# Patient Record
Sex: Male | Born: 1945 | Race: White | Hispanic: No | Marital: Married | Smoking: Former smoker
Health system: Southern US, Community
[De-identification: ages and names within clinical notes are randomized; demographics above are authoritative.]

## PROBLEM LIST (undated history)

## (undated) DIAGNOSIS — I2510 Atherosclerotic heart disease of native coronary artery without angina pectoris: Secondary | ICD-10-CM

## (undated) DIAGNOSIS — E7800 Pure hypercholesterolemia, unspecified: Secondary | ICD-10-CM

## (undated) DIAGNOSIS — G4730 Sleep apnea, unspecified: Secondary | ICD-10-CM

## (undated) DIAGNOSIS — I10 Essential (primary) hypertension: Secondary | ICD-10-CM

## (undated) DIAGNOSIS — G4733 Obstructive sleep apnea (adult) (pediatric): Secondary | ICD-10-CM

## (undated) DIAGNOSIS — S42114A Nondisplaced fracture of body of scapula, right shoulder, initial encounter for closed fracture: Secondary | ICD-10-CM

## (undated) DIAGNOSIS — I2699 Other pulmonary embolism without acute cor pulmonale: Secondary | ICD-10-CM

## (undated) HISTORY — DX: Atherosclerotic heart disease of native coronary artery without angina pectoris: I25.10

## (undated) HISTORY — DX: Obstructive sleep apnea (adult) (pediatric): G47.33

## (undated) HISTORY — DX: Other pulmonary embolism without acute cor pulmonale: I26.99

## (undated) HISTORY — DX: Nondisplaced fracture of body of scapula, right shoulder, initial encounter for closed fracture: S42.114A

## (undated) HISTORY — DX: Sleep apnea, unspecified: G47.30

---

## 2015-11-18 DIAGNOSIS — I10 Essential (primary) hypertension: Secondary | ICD-10-CM | POA: Insufficient documentation

## 2015-11-18 DIAGNOSIS — Z79899 Other long term (current) drug therapy: Secondary | ICD-10-CM | POA: Insufficient documentation

## 2015-11-18 HISTORY — DX: Essential (primary) hypertension: I10

## 2015-11-18 HISTORY — DX: Other long term (current) drug therapy: Z79.899

## 2015-12-04 DIAGNOSIS — L509 Urticaria, unspecified: Secondary | ICD-10-CM | POA: Insufficient documentation

## 2015-12-04 HISTORY — DX: Urticaria, unspecified: L50.9

## 2016-01-20 DIAGNOSIS — G4733 Obstructive sleep apnea (adult) (pediatric): Secondary | ICD-10-CM | POA: Insufficient documentation

## 2017-11-09 ENCOUNTER — Encounter: Payer: Self-pay | Admitting: Gastroenterology

## 2017-12-24 DIAGNOSIS — M19049 Primary osteoarthritis, unspecified hand: Secondary | ICD-10-CM

## 2017-12-24 HISTORY — DX: Primary osteoarthritis, unspecified hand: M19.049

## 2018-10-13 DIAGNOSIS — D696 Thrombocytopenia, unspecified: Secondary | ICD-10-CM

## 2018-10-13 HISTORY — DX: Thrombocytopenia, unspecified: D69.6

## 2019-03-08 HISTORY — PX: COLONOSCOPY: SHX174

## 2019-04-12 DIAGNOSIS — Z91038 Other insect allergy status: Secondary | ICD-10-CM

## 2019-04-12 HISTORY — DX: Other insect allergy status: Z91.038

## 2020-01-03 DIAGNOSIS — E782 Mixed hyperlipidemia: Secondary | ICD-10-CM | POA: Insufficient documentation

## 2020-01-03 HISTORY — DX: Mixed hyperlipidemia: E78.2

## 2020-07-03 DIAGNOSIS — N529 Male erectile dysfunction, unspecified: Secondary | ICD-10-CM

## 2020-07-03 HISTORY — DX: Male erectile dysfunction, unspecified: N52.9

## 2020-07-26 DIAGNOSIS — M8589 Other specified disorders of bone density and structure, multiple sites: Secondary | ICD-10-CM

## 2020-07-26 HISTORY — DX: Other specified disorders of bone density and structure, multiple sites: M85.89

## 2020-08-03 ENCOUNTER — Inpatient Hospital Stay (HOSPITAL_COMMUNITY)
Admission: EM | Admit: 2020-08-03 | Discharge: 2020-08-10 | DRG: 200 | Disposition: A | Discharge status: Home / Self Care | Payer: Medicare HMO | Attending: General Surgery | Admitting: General Surgery

## 2020-08-03 ENCOUNTER — Encounter (HOSPITAL_COMMUNITY): Payer: Self-pay | Admitting: *Deleted

## 2020-08-03 ENCOUNTER — Emergency Department (HOSPITAL_COMMUNITY): Payer: Medicare HMO

## 2020-08-03 ENCOUNTER — Other Ambulatory Visit: Payer: Self-pay

## 2020-08-03 DIAGNOSIS — E7800 Pure hypercholesterolemia, unspecified: Secondary | ICD-10-CM | POA: Diagnosis present

## 2020-08-03 DIAGNOSIS — R0789 Other chest pain: Secondary | ICD-10-CM | POA: Diagnosis present

## 2020-08-03 DIAGNOSIS — S62664A Nondisplaced fracture of distal phalanx of right ring finger, initial encounter for closed fracture: Secondary | ICD-10-CM

## 2020-08-03 DIAGNOSIS — R911 Solitary pulmonary nodule: Secondary | ICD-10-CM | POA: Diagnosis present

## 2020-08-03 DIAGNOSIS — I10 Essential (primary) hypertension: Secondary | ICD-10-CM | POA: Diagnosis present

## 2020-08-03 DIAGNOSIS — Y92410 Unspecified street and highway as the place of occurrence of the external cause: Secondary | ICD-10-CM

## 2020-08-03 DIAGNOSIS — Z7982 Long term (current) use of aspirin: Secondary | ICD-10-CM | POA: Diagnosis not present

## 2020-08-03 DIAGNOSIS — Z87891 Personal history of nicotine dependence: Secondary | ICD-10-CM | POA: Diagnosis not present

## 2020-08-03 DIAGNOSIS — J939 Pneumothorax, unspecified: Secondary | ICD-10-CM

## 2020-08-03 DIAGNOSIS — Z4682 Encounter for fitting and adjustment of non-vascular catheter: Secondary | ICD-10-CM

## 2020-08-03 DIAGNOSIS — Z20822 Contact with and (suspected) exposure to covid-19: Secondary | ICD-10-CM | POA: Diagnosis present

## 2020-08-03 DIAGNOSIS — S62634A Displaced fracture of distal phalanx of right ring finger, initial encounter for closed fracture: Secondary | ICD-10-CM | POA: Diagnosis present

## 2020-08-03 DIAGNOSIS — S42114A Nondisplaced fracture of body of scapula, right shoulder, initial encounter for closed fracture: Secondary | ICD-10-CM

## 2020-08-03 DIAGNOSIS — S61210A Laceration without foreign body of right index finger without damage to nail, initial encounter: Secondary | ICD-10-CM | POA: Diagnosis present

## 2020-08-03 DIAGNOSIS — S60511A Abrasion of right hand, initial encounter: Secondary | ICD-10-CM | POA: Diagnosis present

## 2020-08-03 DIAGNOSIS — S42111A Displaced fracture of body of scapula, right shoulder, initial encounter for closed fracture: Secondary | ICD-10-CM | POA: Diagnosis present

## 2020-08-03 DIAGNOSIS — S70311A Abrasion, right thigh, initial encounter: Secondary | ICD-10-CM | POA: Diagnosis present

## 2020-08-03 DIAGNOSIS — S270XXA Traumatic pneumothorax, initial encounter: Principal | ICD-10-CM | POA: Diagnosis present

## 2020-08-03 DIAGNOSIS — T1490XA Injury, unspecified, initial encounter: Secondary | ICD-10-CM

## 2020-08-03 DIAGNOSIS — S2241XA Multiple fractures of ribs, right side, initial encounter for closed fracture: Secondary | ICD-10-CM

## 2020-08-03 DIAGNOSIS — Z79899 Other long term (current) drug therapy: Secondary | ICD-10-CM | POA: Diagnosis not present

## 2020-08-03 DIAGNOSIS — S50811A Abrasion of right forearm, initial encounter: Secondary | ICD-10-CM | POA: Diagnosis present

## 2020-08-03 HISTORY — DX: Unspecified rider of other motorcycle injured in collision with pedestrian or animal in traffic accident, initial encounter: V20.99XA

## 2020-08-03 HISTORY — DX: Pure hypercholesterolemia, unspecified: E78.00

## 2020-08-03 HISTORY — DX: Essential (primary) hypertension: I10

## 2020-08-03 LAB — COMPREHENSIVE METABOLIC PANEL
ALT: 24 U/L (ref 0–44)
AST: 23 U/L (ref 15–41)
Albumin: 3.8 g/dL (ref 3.5–5.0)
Alkaline Phosphatase: 56 U/L (ref 38–126)
Anion gap: 7 (ref 5–15)
BUN: 15 mg/dL (ref 8–23)
CO2: 22 mmol/L (ref 22–32)
Calcium: 9.4 mg/dL (ref 8.9–10.3)
Chloride: 105 mmol/L (ref 98–111)
Creatinine, Ser: 0.78 mg/dL (ref 0.61–1.24)
GFR, Estimated: >60 mL/min (ref >60)
Glucose, Bld: 112 mg/dL — ABNORMAL HIGH (ref 70–99)
Potassium: 3.6 mmol/L (ref 3.5–5.1)
Sodium: 134 mmol/L — ABNORMAL LOW (ref 135–145)
Total Bilirubin: 0.8 mg/dL (ref 0.3–1.2)
Total Protein: 6.3 g/dL — ABNORMAL LOW (ref 6.5–8.1)

## 2020-08-03 LAB — CBC
HCT: 42.4 % (ref 39.0–52.0)
HCT: 43 % (ref 39.0–52.0)
Hemoglobin: 14.2 g/dL (ref 13.0–17.0)
Hemoglobin: 14.6 g/dL (ref 13.0–17.0)
MCH: 30.5 pg (ref 26.0–34.0)
MCH: 30.4 pg (ref 26.0–34.0)
MCHC: 33.5 g/dL (ref 30.0–36.0)
MCHC: 34 g/dL (ref 30.0–36.0)
MCV: 91.2 fL (ref 80.0–100.0)
MCV: 89.6 fL (ref 80.0–100.0)
Platelets: 118 10*3/uL — ABNORMAL LOW (ref 150–400)
Platelets: 141 10*3/uL — ABNORMAL LOW (ref 150–400)
RBC: 4.65 MIL/uL (ref 4.22–5.81)
RBC: 4.8 MIL/uL (ref 4.22–5.81)
RDW: 12.1 % (ref 11.5–15.5)
RDW: 11.9 % (ref 11.5–15.5)
WBC: 6.1 10*3/uL (ref 4.0–10.5)
WBC: 7.7 10*3/uL (ref 4.0–10.5)
nRBC: 0 % (ref 0.0–0.2)
nRBC: 0 % (ref 0.0–0.2)

## 2020-08-03 LAB — RESP PANEL BY RT-PCR (FLU A&B, COVID) ARPGX2
Influenza A by PCR: NEGATIVE
Influenza B by PCR: NEGATIVE
SARS Coronavirus 2 by RT PCR: NEGATIVE

## 2020-08-03 LAB — I-STAT CHEM 8, ED
BUN: 17 mg/dL (ref 8–23)
Calcium, Ion: 1.21 mmol/L (ref 1.15–1.40)
Chloride: 103 mmol/L (ref 98–111)
Creatinine, Ser: 0.7 mg/dL (ref 0.61–1.24)
Glucose, Bld: 108 mg/dL — ABNORMAL HIGH (ref 70–99)
HCT: 40 % (ref 39.0–52.0)
Hemoglobin: 13.6 g/dL (ref 13.0–17.0)
Potassium: 3.6 mmol/L (ref 3.5–5.1)
Sodium: 135 mmol/L (ref 135–145)
TCO2: 23 mmol/L (ref 22–32)

## 2020-08-03 LAB — URINALYSIS, ROUTINE W REFLEX MICROSCOPIC
Bacteria, UA: NONE SEEN
Bilirubin Urine: NEGATIVE
Glucose, UA: NEGATIVE mg/dL
Ketones, ur: NEGATIVE mg/dL
Leukocytes,Ua: NEGATIVE
Nitrite: NEGATIVE
Protein, ur: NEGATIVE mg/dL
Specific Gravity, Urine: 1.012 (ref 1.005–1.030)
pH: 6 (ref 5.0–8.0)

## 2020-08-03 LAB — LACTIC ACID, PLASMA: Lactic Acid, Venous: 1.4 mmol/L (ref 0.5–1.9)

## 2020-08-03 LAB — ETHANOL: Alcohol, Ethyl (B): <10 mg/dL (ref <10)

## 2020-08-03 LAB — PROTIME-INR
INR: 1.1 (ref 0.8–1.2)
Prothrombin Time: 14.5 seconds (ref 11.4–15.2)

## 2020-08-03 LAB — CREATININE, SERUM
Creatinine, Ser: 0.7 mg/dL (ref 0.61–1.24)
GFR, Estimated: >60 mL/min (ref >60)

## 2020-08-03 LAB — SAMPLE TO BLOOD BANK

## 2020-08-03 MED ORDER — DOCUSATE SODIUM 100 MG PO CAPS
100.0000 mg | ORAL_CAPSULE | Freq: Two times a day (BID) | ORAL | Status: DC
  Administered 2020-08-04 – 2020-08-09 (×12): 100 mg via ORAL
  Filled 2020-08-03 (×13): qty 1

## 2020-08-03 MED ORDER — ONDANSETRON HCL 4 MG/2ML IJ SOLN
4.0000 mg | Freq: Once | INTRAMUSCULAR | Status: AC
  Administered 2020-08-03: 4 mg via INTRAVENOUS
  Filled 2020-08-03: qty 2

## 2020-08-03 MED ORDER — BACITRACIN ZINC 500 UNIT/GM EX OINT
TOPICAL_OINTMENT | Freq: Two times a day (BID) | CUTANEOUS | Status: DC
  Administered 2020-08-06: 1 via TOPICAL
  Filled 2020-08-03 (×3): qty 28.35

## 2020-08-03 MED ORDER — KETOTIFEN FUMARATE 0.025 % OP SOLN: 1.0000 [drp] | Freq: Every day | OPHTHALMIC | Status: DC | PRN

## 2020-08-03 MED ORDER — SODIUM CHLORIDE 0.9% FLUSH: 3.0000 mL | INTRAVENOUS | Status: DC | PRN

## 2020-08-03 MED ORDER — TRAMADOL HCL 50 MG PO TABS
50.0000 mg | ORAL_TABLET | Freq: Four times a day (QID) | ORAL | Status: DC | PRN
Start: 2020-08-03 — End: 2020-08-10
  Administered 2020-08-05 – 2020-08-09 (×2): 50 mg via ORAL
  Filled 2020-08-03 (×2): qty 1

## 2020-08-03 MED ORDER — POLYVINYL ALCOHOL 1.4 % OP SOLN: 1.0000 [drp] | Freq: Every day | OPHTHALMIC | Status: DC | PRN

## 2020-08-03 MED ORDER — SODIUM CHLORIDE 0.9 % IV SOLN: INTRAVENOUS | Status: DC

## 2020-08-03 MED ORDER — OXYCODONE HCL 5 MG PO TABS
5.0000 mg | ORAL_TABLET | ORAL | Status: DC | PRN
  Administered 2020-08-03 – 2020-08-04 (×2): 10 mg via ORAL
  Filled 2020-08-03 (×2): qty 2

## 2020-08-03 MED ORDER — MORPHINE SULFATE (PF) 2 MG/ML IV SOLN
1.0000 mg | INTRAVENOUS | Status: DC | PRN
  Administered 2020-08-03: 2 mg via INTRAVENOUS
  Filled 2020-08-03: qty 1

## 2020-08-03 MED ORDER — SODIUM CHLORIDE 0.9 % IV SOLN: 250.0000 mL | INTRAVENOUS | Status: DC | PRN

## 2020-08-03 MED ORDER — MORPHINE SULFATE (PF) 4 MG/ML IV SOLN
4.0000 mg | Freq: Once | INTRAVENOUS | Status: AC
  Administered 2020-08-03: 4 mg via INTRAVENOUS
  Filled 2020-08-03: qty 1

## 2020-08-03 MED ORDER — AMLODIPINE BESYLATE 10 MG PO TABS
10.0000 mg | ORAL_TABLET | Freq: Every day | ORAL | Status: DC
  Administered 2020-08-04 – 2020-08-09 (×6): 10 mg via ORAL
  Filled 2020-08-03 (×6): qty 1

## 2020-08-03 MED ORDER — IOHEXOL 300 MG/ML  SOLN
100.0000 mL | Freq: Once | INTRAMUSCULAR | Status: AC | PRN
  Administered 2020-08-03: 100 mL via INTRAVENOUS

## 2020-08-03 MED ORDER — ADULT MULTIVITAMIN W/MINERALS CH
1.0000 | ORAL_TABLET | Freq: Every day | ORAL | Status: DC
  Administered 2020-08-04 – 2020-08-09 (×6): 1 via ORAL
  Filled 2020-08-03 (×6): qty 1

## 2020-08-03 MED ORDER — ONDANSETRON 4 MG PO TBDP: 4.0000 mg | ORAL_TABLET | Freq: Four times a day (QID) | ORAL | Status: DC | PRN

## 2020-08-03 MED ORDER — SODIUM CHLORIDE 0.9 % IV BOLUS
1000.0000 mL | Freq: Once | INTRAVENOUS | Status: AC
  Administered 2020-08-03: 1000 mL via INTRAVENOUS

## 2020-08-03 MED ORDER — ROSUVASTATIN CALCIUM 5 MG PO TABS
10.0000 mg | ORAL_TABLET | Freq: Every morning | ORAL | Status: DC
  Administered 2020-08-04 – 2020-08-10 (×7): 10 mg via ORAL
  Filled 2020-08-03 (×7): qty 2

## 2020-08-03 MED ORDER — SODIUM CHLORIDE 0.9% FLUSH
3.0000 mL | Freq: Two times a day (BID) | INTRAVENOUS | Status: DC
  Administered 2020-08-04 – 2020-08-10 (×13): 3 mL via INTRAVENOUS

## 2020-08-03 MED ORDER — ACETAMINOPHEN 325 MG PO TABS: 650.0000 mg | ORAL_TABLET | ORAL | Status: DC | PRN

## 2020-08-03 MED ORDER — ONDANSETRON HCL 4 MG/2ML IJ SOLN
4.0000 mg | Freq: Four times a day (QID) | INTRAMUSCULAR | Status: DC | PRN
  Administered 2020-08-04 (×2): 4 mg via INTRAVENOUS
  Filled 2020-08-03 (×2): qty 2

## 2020-08-03 MED ORDER — ENOXAPARIN SODIUM 30 MG/0.3ML IJ SOSY
30.0000 mg | PREFILLED_SYRINGE | Freq: Two times a day (BID) | INTRAMUSCULAR | Status: DC
  Administered 2020-08-04 – 2020-08-10 (×13): 30 mg via SUBCUTANEOUS
  Filled 2020-08-03 (×13): qty 0.3

## 2020-08-03 NOTE — ED Notes (Signed)
Pt to Ct
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
## 2020-08-03 NOTE — ED Notes (Signed)
Attempted to call report and was advised someone would call me back 

## 2020-08-03 NOTE — Progress Notes (Signed)
   08/03/20 1251  Clinical Encounter Type  Visited With Patient not available  Visit Type Trauma  Referral From Nurse  Consult/Referral To Chaplain   Chaplain is not needed at this time. Chaplain remains available. This note was prepared by Deneen Harts, M.Div..  For questions please contact by phone [phone].

## 2020-08-03 NOTE — ED Notes (Signed)
Wife is at bedside. Pt is requesting something to eat and drink. Advised would ask provider reference same. Wife Sherree Faiella cell [phone] home [phone] would like a call when pt is admitted.
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
## 2020-08-03 NOTE — Progress Notes (Signed)
Pt has pneumo, CPAP is contraindicated at this time. RT will continue to monitor.

## 2020-08-03 NOTE — ED Notes (Signed)
Dr. Particia Nearing at bedside updating pt on plan of care.

## 2020-08-03 NOTE — ED Notes (Signed)
Attempted to call report and was advised someone would be calling me back

## 2020-08-03 NOTE — ED Notes (Signed)
Received verbal report from Jordan N. RN at this time 
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
## 2020-08-03 NOTE — ED Notes (Signed)
Rt side and rt shoulder pain at this time. With abrasion noted to rt shoulder.

## 2020-08-03 NOTE — ED Notes (Signed)
Wife called and made aware of admit status 

## 2020-08-03 NOTE — Progress Notes (Signed)
Orthopedic Tech Progress Note Patient Details:  Timothy [name] 1945-12-17 [id number]  Ortho Devices Type of Ortho Device: Finger splint Ortho Device/Splint Location: rue. 4th finger Ortho Device/Splint Interventions: Ordered, Application, Adjustment   Post Interventions Patient Tolerated: Well Instructions Provided: Care of device, Adjustment of device  Trinna Post 08/03/2020, 9:24 PM
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
## 2020-08-04 ENCOUNTER — Inpatient Hospital Stay (HOSPITAL_COMMUNITY): Payer: Medicare HMO

## 2020-08-04 DIAGNOSIS — S42114A Nondisplaced fracture of body of scapula, right shoulder, initial encounter for closed fracture: Secondary | ICD-10-CM

## 2020-08-04 MED ORDER — ACETAMINOPHEN 325 MG PO TABS
650.0000 mg | ORAL_TABLET | Freq: Four times a day (QID) | ORAL | Status: DC
  Administered 2020-08-04 – 2020-08-10 (×22): 650 mg via ORAL
  Filled 2020-08-04 (×22): qty 2

## 2020-08-04 MED ORDER — MIDAZOLAM HCL 2 MG/2ML IJ SOLN
2.0000 mg | Freq: Once | INTRAMUSCULAR | Status: AC
  Administered 2020-08-04: 1 mg via INTRAVENOUS
  Filled 2020-08-04: qty 2

## 2020-08-04 MED ORDER — FENTANYL CITRATE (PF) 100 MCG/2ML IJ SOLN
100.0000 ug | Freq: Once | INTRAMUSCULAR | Status: AC
Start: 2020-08-04 — End: 2020-08-04
  Administered 2020-08-04: 50 ug via INTRAVENOUS
  Filled 2020-08-04: qty 2

## 2020-08-04 MED ORDER — METHOCARBAMOL 500 MG PO TABS
500.0000 mg | ORAL_TABLET | Freq: Three times a day (TID) | ORAL | Status: DC
  Administered 2020-08-04 – 2020-08-10 (×19): 500 mg via ORAL
  Filled 2020-08-04 (×19): qty 1

## 2020-08-04 MED ORDER — CALCIUM CARBONATE ANTACID 500 MG PO CHEW
2.0000 | CHEWABLE_TABLET | Freq: Four times a day (QID) | ORAL | Status: DC | PRN
  Administered 2020-08-05: 400 mg via ORAL
  Filled 2020-08-04: qty 2

## 2020-08-04 NOTE — Procedures (Signed)
Insertion of Chest Tube Procedure Note  Timothy [name]  [id number]  [birth year]  Date:08/04/20  Time:9:38 AM    Provider Performing: Juliet Rude PA-C  Procedure: Chest Tube Insertion [id number])  Indication(s) Pneumothorax  Consent Risks of the procedure as well as the alternatives and risks of each were explained to the patient and/or caregiver.  Consent for the procedure was obtained and is signed in the bedside chart  Anesthesia Topical with 1% lidocaine and 1 mg versed IV and 50 mcg fentanyl IV   Time Out Verified patient identification, verified procedure, site/side was marked, verified correct patient position, special equipment/implants available, medications/allergies/relevant history reviewed, required imaging and test results available.   Sterile Technique Maximal sterile technique including full sterile barrier drape, hand hygiene, sterile gown, sterile gloves, mask, hair covering, sterile ultrasound probe cover (if used).   Procedure Description Ultrasound not used to identify appropriate pleural anatomy for placement and overlying skin marked. Area of placement cleaned and draped in sterile fashion.  A 14 French pigtail pleural catheter was placed into the right pleural space using Seldinger technique. Appropriate return of air was obtained.  The tube was connected to atrium and placed on -20 cm H2O wall suction.   Complications/Tolerance None; patient tolerated the procedure well. Chest X-ray is ordered to verify placement.   EBL Minimal  Specimen(s) none  Juliet Rude, [facility] 08/04/2020, 9:39 AM Please see Amion for pager number during day hours 7:00am-4:30pm

## 2020-08-04 NOTE — Discharge Instructions (Addendum)
No lifting overhead or behind with your right arm. You may wear a sling for comfort and come out of that sling as comfort allows.  "5 mm solid nodule in the right upper lobe. No follow-up needed if patient is low-risk. Non-contrast chest CT can be considered in 12 months if patient is high-risk. This recommendation follows the consensus statement: Guidelines for Management of Incidental Pulmonary Nodules Detected on CT Images: From the Fleischner Society 2017; Radiology 2017; 284:228-243."  As you were a former smoker, you should follow up with your primary care to determine if you need 1 year follow up for this nodule.
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
## 2020-08-04 NOTE — Progress Notes (Signed)
CPAP use is contraindicated at this time due to pt has pnuemo. CPAP held at this time.
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
## 2020-08-04 NOTE — Plan of Care (Signed)
Patient arrived on unit from ED. Alert and oriented. Complaints of right shoulder and arm pain with movement. No other complaints voiced. Pt settled into room. Assessment completed. Will continue to monitor as per orders.

## 2020-08-04 NOTE — Plan of Care (Signed)
  Problem: Education: Goal: Knowledge of General Education information will improve Description: Including pain rating scale, medication(s)/side effects and non-pharmacologic comfort measures Outcome: Progressing   Problem: Health Behavior/Discharge Planning: Goal: Ability to manage health-related needs will improve Outcome: Progressing   Problem: Clinical Measurements: Goal: Ability to maintain clinical measurements within normal limits will improve Outcome: Progressing Goal: Respiratory complications will improve Outcome: Progressing Goal: Cardiovascular complication will be avoided Outcome: Progressing   Problem: Activity: Goal: Risk for activity intolerance will decrease Outcome: Progressing   Problem: Elimination: Goal: Will not experience complications related to urinary retention Outcome: Progressing   Problem: Pain Managment: Goal: General experience of comfort will improve Outcome: Progressing   Problem: Safety: Goal: Ability to remain free from injury will improve Outcome: Progressing   Problem: Skin Integrity: Goal: Risk for impaired skin integrity will decrease Outcome: Progressing   
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
## 2020-08-05 ENCOUNTER — Inpatient Hospital Stay (HOSPITAL_COMMUNITY): Payer: Medicare HMO
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
## 2020-08-06 ENCOUNTER — Inpatient Hospital Stay (HOSPITAL_COMMUNITY): Payer: Medicare HMO

## 2020-08-06 MED ORDER — DIPHENHYDRAMINE HCL 25 MG PO CAPS
25.0000 mg | ORAL_CAPSULE | Freq: Every evening | ORAL | Status: DC | PRN
  Administered 2020-08-06: 50 mg via ORAL
  Filled 2020-08-06: qty 1

## 2020-08-06 MED ORDER — POLYETHYLENE GLYCOL 3350 17 G PO PACK
17.0000 g | PACK | Freq: Every day | ORAL | Status: DC
  Administered 2020-08-06 – 2020-08-08 (×3): 17 g via ORAL
  Filled 2020-08-06 (×3): qty 1
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
## 2020-08-06 NOTE — Progress Notes (Signed)
Pt refused CPAP for the night.   

## 2020-08-06 NOTE — Progress Notes (Signed)
Orthopedic Tech Progress Note Patient Details:  Timothy [name] 08-07-1945 [id number]  Ortho Devices Type of Ortho Device: Arm sling Ortho Device/Splint Location: Right Arm Ortho Device/Splint Interventions: Application   Post Interventions Patient Tolerated: Well Instructions Provided: Care of device, Adjustment of device  Syrianna Schillaci E Ellysia Char 08/06/2020, 11:45 AM
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
## 2020-08-07 ENCOUNTER — Inpatient Hospital Stay (HOSPITAL_COMMUNITY): Payer: Medicare HMO

## 2020-08-07 MED ORDER — ZOLPIDEM TARTRATE 5 MG PO TABS
5.0000 mg | ORAL_TABLET | Freq: Every evening | ORAL | Status: DC | PRN
  Administered 2020-08-07 – 2020-08-09 (×3): 5 mg via ORAL
  Filled 2020-08-07 (×3): qty 1

## 2020-08-07 NOTE — Care Management Important Message (Signed)
Important Message  Patient Details  Name: Timothy [name] MRN: [id number] Date of Birth: 1945/07/24   Medicare Important Message Given:  Yes     Kennidee Heyne P Alvino Lechuga 08/07/2020, 1:30 PM
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
## 2020-08-07 NOTE — Progress Notes (Signed)
Pt refused CPAP for the night.   

## 2020-08-07 NOTE — Progress Notes (Signed)
Pt refused cpap for tonight 
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
## 2020-08-08 ENCOUNTER — Inpatient Hospital Stay (HOSPITAL_COMMUNITY): Payer: Medicare HMO

## 2020-08-08 ENCOUNTER — Ambulatory Visit: Payer: Medicare HMO | Admitting: Podiatry

## 2020-08-08 MED ORDER — BISACODYL 10 MG RE SUPP
10.0000 mg | Freq: Once | RECTAL | Status: AC
  Administered 2020-08-08: 10 mg via RECTAL
  Filled 2020-08-08: qty 1

## 2020-08-08 MED ORDER — POLYETHYLENE GLYCOL 3350 17 G PO PACK
17.0000 g | PACK | Freq: Two times a day (BID) | ORAL | Status: DC
  Administered 2020-08-09: 17 g via ORAL
  Filled 2020-08-08 (×4): qty 1
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
## 2020-08-08 NOTE — TOC CAGE-AID Note (Signed)
Transition of Care [facility]) - CAGE-AID Screening   Patient Details  Name: Timothy [name] MRN: [id number] Date of Birth: 1945-07-10  Transition of Care [facility]) CM/SW Contact:    Timothy [name], LCSWA Phone Number: 08/08/2020, 3:08 PM   Clinical Narrative: .Pt participated in Cage-Aid.  Pt denies use of substance and alcohol.  Timothy [name], MSW, LCSW-A Pronouns:  She/Her/Hers                          [place] Clinical Social WorkerTransitions of Care Cell:  [phone] [name]    CAGE-AID Screening: Substance Abuse Screening unable to be completed due to: : Patient unable to participate  Have You Ever Felt You Ought to Cut Down on Your Drinking or Drug Use?: No Have People Annoyed You By Critizing Your Drinking Or Drug Use?: No Have You Felt Bad Or Guilty About Your Drinking Or Drug Use?: No Have You Ever Had a Drink or Used Drugs First Thing In The Morning to Steady Your Nerves or to Get Rid of a Hangover?: No CAGE-AID Score: 0  Substance Abuse Education Offered: No
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
## 2020-08-09 ENCOUNTER — Inpatient Hospital Stay (HOSPITAL_COMMUNITY): Payer: Medicare HMO
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
## 2020-08-10 ENCOUNTER — Inpatient Hospital Stay (HOSPITAL_COMMUNITY): Payer: Medicare HMO

## 2020-08-10 LAB — CREATININE, SERUM
Creatinine, Ser: 0.73 mg/dL (ref 0.61–1.24)
GFR, Estimated: >60 mL/min (ref >60)

## 2020-08-10 MED ORDER — TRAMADOL HCL 50 MG PO TABS: 50.0000 mg | ORAL_TABLET | Freq: Four times a day (QID) | ORAL | 0 refills | Status: DC | PRN

## 2020-08-10 MED ORDER — METHOCARBAMOL 500 MG PO TABS: 500.0000 mg | ORAL_TABLET | Freq: Three times a day (TID) | ORAL | 0 refills | Status: DC | PRN
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
## 2020-08-21 ENCOUNTER — Other Ambulatory Visit: Payer: Self-pay | Admitting: General Surgery

## 2020-08-21 ENCOUNTER — Ambulatory Visit
Admission: RE | Admit: 2020-08-21 | Discharge: 2020-08-21 | Disposition: A | Discharge status: Home / Self Care | Payer: Medicare HMO | Source: Ambulatory Visit | Attending: General Surgery | Admitting: General Surgery

## 2020-08-21 DIAGNOSIS — J939 Pneumothorax, unspecified: Secondary | ICD-10-CM

## 2020-08-26 ENCOUNTER — Encounter: Payer: Self-pay | Admitting: Orthopaedic Surgery

## 2020-08-26 ENCOUNTER — Other Ambulatory Visit: Payer: Self-pay

## 2020-08-26 ENCOUNTER — Ambulatory Visit (INDEPENDENT_AMBULATORY_CARE_PROVIDER_SITE_OTHER): Payer: Medicare HMO | Admitting: Orthopaedic Surgery

## 2020-08-26 ENCOUNTER — Ambulatory Visit (INDEPENDENT_AMBULATORY_CARE_PROVIDER_SITE_OTHER): Payer: Medicare HMO

## 2020-08-26 DIAGNOSIS — M25511 Pain in right shoulder: Secondary | ICD-10-CM

## 2020-08-26 DIAGNOSIS — S42114D Nondisplaced fracture of body of scapula, right shoulder, subsequent encounter for fracture with routine healing: Secondary | ICD-10-CM
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
## 2020-09-02 ENCOUNTER — Ambulatory Visit: Payer: Medicare HMO | Admitting: Podiatry

## 2020-09-02 ENCOUNTER — Other Ambulatory Visit: Payer: Self-pay

## 2020-09-02 DIAGNOSIS — M79676 Pain in unspecified toe(s): Secondary | ICD-10-CM | POA: Diagnosis not present

## 2020-09-02 DIAGNOSIS — L600 Ingrowing nail: Secondary | ICD-10-CM

## 2020-09-02 NOTE — Progress Notes (Signed)
  Subjective:  Patient ID: Timothy [name], male    DOB: 23-Sep-1945,  MRN: [id number]  Chief Complaint  Patient presents with   Nail Problem    BL hallux medial borders pain x few years -no redness/swelling/drainage -worse with shoes tx: epsom salt and neosporin     75 y.o. male presents with the above complaint. History confirmed with patient.   Objective:  Physical Exam: warm, good capillary refill, no trophic changes or ulcerative lesions, normal DP and PT pulses, and normal sensory exam.  Painful ingrowing nail at both borders of the left, right, hallux; without warmth, erythema or drainage  Assessment:   1. Ingrown nail   2. Pain around toenail      Plan:  Patient was evaluated and treated and all questions answered.  Ingrown Nail, bilateral -Palliative debridement of ingrowing nails in slant-back fashion to patient relief. Should issues persist consider more extensive removal.  Return if symptoms worsen or fail to improve.

## 2020-09-23 ENCOUNTER — Encounter: Payer: Self-pay | Admitting: Orthopaedic Surgery

## 2020-09-23 ENCOUNTER — Other Ambulatory Visit: Payer: Self-pay

## 2020-09-23 ENCOUNTER — Ambulatory Visit: Payer: Medicare HMO | Admitting: Orthopaedic Surgery

## 2020-09-23 DIAGNOSIS — S42114D Nondisplaced fracture of body of scapula, right shoulder, subsequent encounter for fracture with routine healing: Secondary | ICD-10-CM

## 2020-09-23 DIAGNOSIS — M25511 Pain in right shoulder: Secondary | ICD-10-CM
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
## 2021-07-02 DIAGNOSIS — D708 Other neutropenia: Secondary | ICD-10-CM

## 2021-07-02 HISTORY — DX: Other neutropenia: D70.8
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
## 2022-08-03 ENCOUNTER — Ambulatory Visit: Payer: Medicare HMO | Admitting: Podiatry

## 2022-08-03 DIAGNOSIS — L600 Ingrowing nail: Secondary | ICD-10-CM

## 2022-08-03 NOTE — Progress Notes (Signed)
  Subjective:  Patient ID: Timothy [name], male    DOB: [birth year],  MRN: [id number]  Chief Complaint  Patient presents with   Ingrown Toenail    Ingrown to bilateral hallux-medial borders. Not diabetic. Soreness to the borders. Patient has ingrown removed about 2 years ago.     77 y.o. male presents with concern for pain to the bilateral hallux medial border.  Has previously had a aggressive slant back procedure done by Dr. Samuella Cota couple years ago.  Says it did not resolve his pain.  Hurts with any pressure on the area.  Denies drainage.  Past Medical History:  Diagnosis Date   Hypercholesteremia    Hypertension     Allergies  Allergen Reactions   Wasp Venom Protein Anaphylaxis   Lisinopril Hives    ROS: Negative except as per HPI above  Objective:  General: AAO x3, NAD  Dermatological: Incurvation is present along the medial nail border of the bilateral great toe. There is localized edema without any erythema or increase in warmth around the nail border. There is no drainage or pus. There is no ascending cellulitis. No malodor. No open lesions or pre-ulcerative lesions.    Vascular:  Dorsalis Pedis artery and Posterior Tibial artery pedal pulses are 2/4 bilateral.  Capillary fill time < 3 sec to all digits.   Neruologic: Grossly intact via light touch bilateral. Protective threshold intact to all sites bilateral.   Musculoskeletal: No gross boney pedal deformities bilateral. No pain, crepitus, or limitation noted with foot and ankle range of motion bilateral. Muscular strength 5/5 in all groups tested bilateral.  Gait: Unassisted, Nonantalgic.   No images are attached to the encounter.  Assessment:   1. Ingrown nail of great toe of right foot   2. Ingrown nail of great toe of left foot      Plan:  Patient was evaluated and treated and all questions answered.   Ingrown Nail, bilaterally -Patient elects to proceed with minor surgery to remove ingrown toenail  today. Consent reviewed and signed by patient. -Ingrown nail excised. See procedure note. -Educated on post-procedure care including soaking. Written instructions provided and reviewed. -Patient to follow up in 2 weeks for nail check.  Procedure: Excision of Ingrown Toenail Location: Bilateral 1st toe medial nail borders. Anesthesia: Lidocaine 1% plain; 1.5 mL and Marcaine 0.5% plain; 1.5 mL, digital block. Skin Prep: Betadine. Dressing: Silvadene; telfa; dry, sterile, compression dressing. Technique: Following skin prep, the toe was exsanguinated and a tourniquet was secured at the base of the toe. The affected nail border was freed, split with a nail splitter, and excised. Chemical matrixectomy was then performed with phenol and irrigated out with alcohol. The tourniquet was then removed and sterile dressing applied. Disposition: Patient tolerated procedure well. Patient to return in 2 weeks for follow-up.    Return in about 2 weeks (around 08/17/2022) for nail check.          Corinna Gab, DPM [facility] / [facility]

## 2022-08-03 NOTE — Patient Instructions (Signed)

## 2022-08-17 ENCOUNTER — Ambulatory Visit: Payer: Medicare HMO | Admitting: Podiatry

## 2022-08-17 DIAGNOSIS — L600 Ingrowing nail: Secondary | ICD-10-CM | POA: Diagnosis not present

## 2022-08-17 NOTE — Progress Notes (Signed)
Subjective: Timothy [name] is a 77 y.o.  male returns to office today for follow up evaluation after having bilateral hallux medial border nail ingrown removal with phenol and alcohol matrixectomy approximately 2 weeks ago. Patient has been soaking using epsom salts and applying topical antibiotic covered with bandaid daily. Patient denies fevers, chills, nausea, vomiting. Denies any calf pain, chest pain, SOB.   Objective:  Vitals: Reviewed  General: Well developed, nourished, in no acute distress, alert and oriented x3   Dermatology: Skin is warm, dry and supple bilateral. bilateral hallux nail border appears to be clean, dry, with mild granular tissue and surrounding scab. There is no surrounding erythema, edema, drainage/purulence. The remaining nails appear unremarkable at this time. There are no other lesions or other signs of infection present.  Neurovascular status: Intact. No lower extremity swelling; No pain with calf compression bilateral.  Musculoskeletal: Decreased tenderness to palpation of the bilateral hallux nail fold(s). Muscular strength within normal limits bilateral.   Assesement and Plan: S/p phenol and alcohol matrixectomy to the  bilateral hallux nail medial, doing well.   -Continue soaking in epsom salts twice a day followed by antibiotic ointment and a band-aid. Can leave uncovered at night. Continue this until completely healed.  -If the area has not healed in 2 weeks, call the office for follow-up appointment, or sooner if any problems arise.  -Monitor for any signs/symptoms of infection. Call the office immediately if any occur or go directly to the emergency room. Call with any questions/concerns.        Corinna Gab, DPM [facility] / [facility]                   08/17/2022

## 2023-01-07 DIAGNOSIS — K219 Gastro-esophageal reflux disease without esophagitis: Secondary | ICD-10-CM

## 2023-01-07 HISTORY — DX: Gastro-esophageal reflux disease without esophagitis: K21.9

## 2023-05-16 DIAGNOSIS — R079 Chest pain, unspecified: Secondary | ICD-10-CM | POA: Diagnosis not present

## 2023-05-16 DIAGNOSIS — R001 Bradycardia, unspecified: Secondary | ICD-10-CM | POA: Diagnosis not present

## 2023-05-17 ENCOUNTER — Telehealth: Payer: Self-pay

## 2023-05-17 DIAGNOSIS — I493 Ventricular premature depolarization: Secondary | ICD-10-CM

## 2023-05-17 DIAGNOSIS — R072 Precordial pain: Secondary | ICD-10-CM

## 2023-05-17 MED ORDER — METOPROLOL TARTRATE 50 MG PO TABS
ORAL_TABLET | ORAL | 0 refills | Status: DC
Start: 2023-05-17 — End: 2023-05-28

## 2023-05-17 NOTE — Telephone Encounter (Signed)
 Medication Instructions:   TAKE: Metoprolol 50mg  1 tablet 2 hours prior to CT Scan   Lab Work: None If you have labs (blood work) drawn today and your tests are completely normal, you will receive your results only by: MyChart Message (if you have MyChart) OR A paper copy in the mail If you have any lab test that is abnormal or we need to change your treatment, we will call you to review the results.   Testing/Procedures: Your physician has requested that you have cardiac CT. Cardiac computed tomography (CT) is a painless test that uses an x-ray machine to take clear, detailed pictures of your heart. For further information please visit [email or web address] Please follow instruction sheet as given.    Your Cardiac CT will be scheduled at:   [facility] located off [facility] at the hospital.  Please arrive 30 minutes prior to your appointment time.  You can use the FREE valet parking offered at entrance to outpatient center (encouraged to control the heart rate for the test)   Please follow these instructions carefully (unless otherwise directed):  Hold all erectile dysfunction medications at least 3 days (72 hrs) prior to test.  On the Night Before the Test: Be sure to Drink plenty of water. Do not consume any caffeinated/decaffeinated beverages or chocolate 12 hours prior to your test. Do not take any antihistamines 12 hours prior to your test.    On the Day of the Test: Drink plenty of water until 1 hour prior to the test. Do not eat any food 4 hours prior to the test. No smoking 4 hours prior to test. You may take your regular medications prior to the test.  Take metoprolol (Lopressor) two hours prior to test.     After the Test: Drink plenty of water. After receiving IV contrast, you may experience a mild flushed feeling. This is normal. On occasion, you may experience a mild rash up to 24 hours after the test. This is not dangerous. If this  occurs, you can take Benadryl 25 mg and increase your fluid intake. If you experience trouble breathing, this can be serious. If it is severe call 911 IMMEDIATELY. If it is mild, please call our office. If you take any of these medications: Glipizide/Metformin, Avandament, Glucavance, please do not take 48 hours after completing test unless otherwise instructed.  We will call to schedule your test 2-4 weeks out understanding that some insurance companies will need an authorization prior to the service being performed.      Follow-Up: At [facility], you and your health needs are our priority.  As part of our continuing mission to provide you with exceptional heart care, we have created designated Provider Care Teams.  These Care Teams include your primary Cardiologist (physician) and Advanced Practice Providers (APPs -  Physician Assistants and Nurse Practitioners) who all work together to provide you with the care you need, when you need it.  We recommend signing up for the patient portal called "MyChart".  Sign up information is provided on this After Visit Summary.  MyChart is used to connect with patients for Virtual Visits (Telemedicine).  Patients are able to view lab/test results, encounter notes, upcoming appointments, etc.  Non-urgent messages can be sent to your provider as well.   To learn more about what you can do with MyChart, go to [email or web address].    Your next appointment:     The format for your next appointment:   In Person  Provider:   Ralene Burger, MD    Other Instructions Cardiac CT Angiogram A cardiac CT angiogram is a procedure to look at the heart and the area around the heart. It may be done to help find the cause of chest pains or other symptoms of heart disease. During this procedure, a substance called contrast dye is injected into the blood vessels in the area to be checked. A large X-ray machine, called a CT scanner, then takes detailed  pictures of the heart and the surrounding area. The procedure is also sometimes called a coronary CT angiogram, coronary artery scanning, or CTA. A cardiac CT angiogram allows the health care provider to see how well blood is flowing to and from the heart. The health care provider will be able to see if there are any problems, such as: Blockage or narrowing of the coronary arteries in the heart. Fluid around the heart. Signs of weakness or disease in the muscles, valves, and tissues of the heart. Tell a health care provider about: Any allergies you have. This is especially important if you have had a previous allergic reaction to contrast dye. All medicines you are taking, including vitamins, herbs, eye drops, creams, and over-the-counter medicines. Any blood disorders you have. Any surgeries you have had. Any medical conditions you have. Whether you are pregnant or may be pregnant. Any anxiety disorders, chronic pain, or other conditions you have that may increase your stress or prevent you from lying still. What are the risks? Generally, this is a safe procedure. However, problems may occur, including: Bleeding. Infection. Allergic reactions to medicines or dyes. Damage to other structures or organs. Kidney damage from the contrast dye that is used. Increased risk of cancer from radiation exposure. This risk is low. Talk with your health care provider about: The risks and benefits of testing. How you can receive the lowest dose of radiation. What happens before the procedure? Wear comfortable clothing and remove any jewelry, glasses, dentures, and hearing aids. Follow instructions from your health care provider about eating and drinking. This may include: For 12 hours before the procedure -- avoid caffeine. This includes tea, coffee, soda, energy drinks, and diet pills. Drink plenty of water or other fluids that do not have caffeine in them. Being well hydrated can prevent  complications. For 4-6 hours before the procedure -- stop eating and drinking. The contrast dye can cause nausea, but this is less likely if your stomach is empty. Ask your health care provider about changing or stopping your regular medicines. This is especially important if you are taking diabetes medicines, blood thinners, or medicines to treat problems with erections (erectile dysfunction). What happens during the procedure?  Hair on your chest may need to be removed so that small sticky patches called electrodes can be placed on your chest. These will transmit information that helps to monitor your heart during the procedure. An IV will be inserted into one of your veins. You might be given a medicine to control your heart rate during the procedure. This will help to ensure that good images are obtained. You will be asked to lie on an exam table. This table will slide in and out of the CT machine during the procedure. Contrast dye will be injected into the IV. You might feel warm, or you may get a metallic taste in your mouth. You will be given a medicine called nitroglycerin. This will relax or dilate the arteries in your heart. The table that you are lying  on will move into the CT machine tunnel for the scan. The person running the machine will give you instructions while the scans are being done. You may be asked to: Keep your arms above your head. Hold your breath. Stay very still, even if the table is moving. When the scanning is complete, you will be moved out of the machine. The IV will be removed. The procedure may vary among health care providers and hospitals. What can I expect after the procedure? After your procedure, it is common to have: A metallic taste in your mouth from the contrast dye. A feeling of warmth. A headache from the nitroglycerin. Follow these instructions at home: Take over-the-counter and prescription medicines only as told by your health care  provider. If you are told, drink enough fluid to keep your urine pale yellow. This will help to flush the contrast dye out of your body. Most people can return to their normal activities right after the procedure. Ask your health care provider what activities are safe for you. It is up to you to get the results of your procedure. Ask your health care provider, or the department that is doing the procedure, when your results will be ready. Keep all follow-up visits as told by your health care provider. This is important. Contact a health care provider if: You have any symptoms of allergy to the contrast dye. These include: Shortness of breath. Rash or hives. A racing heartbeat. Summary A cardiac CT angiogram is a procedure to look at the heart and the area around the heart. It may be done to help find the cause of chest pains or other symptoms of heart disease. During this procedure, a large X-ray machine, called a CT scanner, takes detailed pictures of the heart and the surrounding area after a contrast dye has been injected into blood vessels in the area. Ask your health care provider about changing or stopping your regular medicines before the procedure. This is especially important if you are taking diabetes medicines, blood thinners, or medicines to treat erectile dysfunction. If you are told, drink enough fluid to keep your urine pale yellow. This will help to flush the contrast dye out of your body. This information is not intended to replace advice given to you by your health care provider. Make sure you discuss any questions you have with your health care provider. Document Revised: 05/08/2021 Document Reviewed: 09/14/2018 Elsevier Patient Education  2023 Elsevier Inc.   Important Information About Sugar

## 2023-05-17 NOTE — Telephone Encounter (Signed)
 Spoke with pt and advised that Dr. Krasowski had ordered a CT angio at [facility]. Reviewed instructions for CT Angio with pt and left written instructions at front desk for pt. Called in Metoprolol 50mg  to take 2 hours prior to CT  Scan. Orders given to Johnna at front desk.

## 2023-05-19 ENCOUNTER — Telehealth: Payer: Self-pay | Admitting: Cardiology

## 2023-05-19 NOTE — Telephone Encounter (Signed)
I did not need this encounter. °

## 2023-05-24 ENCOUNTER — Telehealth: Payer: Self-pay | Admitting: Cardiology

## 2023-05-24 NOTE — Telephone Encounter (Signed)
 Called and spoke to patient. Reviewed cardiac ct instructions. Patient verbalized understanding and had no further questions.

## 2023-05-24 NOTE — Telephone Encounter (Signed)
 Patient says he's scheduled for a CT first thing in the morning at [facility] and he received conflicting NPO instructions. Can he have water beforehand? Time restrictions? Please advise.

## 2023-05-26 ENCOUNTER — Telehealth: Payer: Self-pay | Admitting: Cardiology

## 2023-05-26 NOTE — Telephone Encounter (Signed)
 Called patient and informed him that Dr. Gordan Latina would be interpreting the test he had at [facility] today and as soon as we had the results we would give him a call. Patient verbalized understanding and had no further questions at this time.

## 2023-05-26 NOTE — Telephone Encounter (Signed)
 Patient had a scan done at [facility] yesterday and would like to know if we've gotten the results back yet and if so can we give him a call. CB # [phone]

## 2023-05-27 ENCOUNTER — Telehealth: Payer: Self-pay

## 2023-05-27 NOTE — Telephone Encounter (Signed)
 Called pt to come in at 11AM tomorrow to discuss CT scan. Pt agreed and verbalized understanding.

## 2023-05-28 ENCOUNTER — Ambulatory Visit: Attending: Cardiology | Admitting: Cardiology

## 2023-05-28 ENCOUNTER — Encounter: Payer: Self-pay | Admitting: Cardiology

## 2023-05-28 VITALS — BP 140/82 | HR 51 | Ht 70.0 in | Wt 205.0 lb

## 2023-05-28 DIAGNOSIS — R072 Precordial pain: Secondary | ICD-10-CM | POA: Diagnosis not present

## 2023-05-28 DIAGNOSIS — I25118 Atherosclerotic heart disease of native coronary artery with other forms of angina pectoris: Secondary | ICD-10-CM

## 2023-05-28 DIAGNOSIS — G4733 Obstructive sleep apnea (adult) (pediatric): Secondary | ICD-10-CM | POA: Diagnosis not present

## 2023-05-28 DIAGNOSIS — E782 Mixed hyperlipidemia: Secondary | ICD-10-CM | POA: Diagnosis not present

## 2023-05-28 DIAGNOSIS — I2510 Atherosclerotic heart disease of native coronary artery without angina pectoris: Secondary | ICD-10-CM

## 2023-05-28 HISTORY — DX: Atherosclerotic heart disease of native coronary artery without angina pectoris: I25.10

## 2023-05-28 MED ORDER — ISOSORBIDE MONONITRATE ER 30 MG PO TB24: 30.0000 mg | ORAL_TABLET | Freq: Every day | ORAL | 0 refills | Status: DC

## 2023-05-28 MED ORDER — ROSUVASTATIN CALCIUM 20 MG PO TABS
20.0000 mg | ORAL_TABLET | Freq: Every day | ORAL | 0 refills | Status: DC
Start: 2023-05-28 — End: 2023-08-13

## 2023-05-28 MED ORDER — NITROGLYCERIN 0.4 MG SL SUBL
0.4000 mg | SUBLINGUAL_TABLET | SUBLINGUAL | 6 refills | Status: AC | PRN
Start: 2023-05-28 — End: ?

## 2023-05-28 NOTE — Patient Instructions (Addendum)
 Medication Instructions:   INCREASE: Crestor  to 20mg  1 tablet daily  START: Imdur 30mg  1 tablet daily  START: Nitroglycerin- Use nitroglycerin 1 tablet placed under the tongue at the first sign of chest pain or an angina attack. 1 tablet may be used every 5 minutes as needed, for up to 15 minutes. Do not take more than 3 tablets in 15 minutes. If pain persist call 911 or go to the nearest ED.     Lab Work: Your physician recommends that you return for lab work in: 6 weeks You need to have labs done when you are fasting.  You can come Monday through Friday 8:30 am to 12:00 pm and 1:15 to 4:30. You do not need to make an appointment as the order has already been placed. The labs you are going to have done are  Lipid, AST, ALT   Testing/Procedures: Your physician has requested that you have a carotid duplex. This test is an ultrasound of the carotid arteries in your neck. It looks at blood flow through these arteries that supply the brain with blood. Allow one hour for this exam. There are no restrictions or special instructions.    Follow-Up: At [facility], you and your health needs are our priority.  As part of our continuing mission to provide you with exceptional heart care, we have created designated Provider Care Teams.  These Care Teams include your primary Cardiologist (physician) and Advanced Practice Providers (APPs -  Physician Assistants and Nurse Practitioners) who all work together to provide you with the care you need, when you need it.  We recommend signing up for the patient portal called "MyChart".  Sign up information is provided on this After Visit Summary.  MyChart is used to connect with patients for Virtual Visits (Telemedicine).  Patients are able to view lab/test results, encounter notes, upcoming appointments, etc.  Non-urgent messages can be sent to your provider as well.   To learn more about what you can do with MyChart, go to [email or web address].    Your  next appointment:   1 month(s)  The format for your next appointment:   In Person  Provider:   Ralene Burger, MD    Other Instructions NA

## 2023-05-28 NOTE — Progress Notes (Addendum)
 Cardiology Consultation:    Date:  05/28/2023   ID:  Timothy [name], DOB [birth year], MRN [id number]  PCP:  Karena Ota, MD  Cardiologist:  Ralene Burger, MD   Referring MD: Karena Ota, MD   Chief Complaint  Patient presents with   Results          History of Present Illness:    Timothy [name] is a 78 y.o. male who is being seen today for the evaluation of abnormal coronary CT angio at the request of Karena Ota, MD. past medical history significant for hyperlipidemia, essential hypertension, family history of premature atherosclerosis, he exercised on the regular basis walking to 2-1/2 miles, recently he ended up in the hospital because of atypical chest pain.  Rule out for myocardial infarction, stress test has been performed which shows small defect indicating possibility of ischemia involving mid and apical portion of the anterior wall, after that he got coronary CT angio, coronary CT angio showed quite advanced atherosclerosis with total calcium  score of 3168, diffuse disease with a lot of distal disease look like most critical lesion was in the large obtuse marginal 1 branch.  His RCA was nondominant there also appears to have some significant stenosis.  He comes today to talk about this.  He said he is doing fine he described episodes of chest pain that he localized to the left upper portion of the chest he pinpoint this with the finger and he said sometimes happen when he walks sometimes up and he does not think.  He exercised on the regular basis walking and usually have no difficulty sometimes he will develop chest pain.  Does not smoke does have family history of coronary artery disease.  Being treated for high cholesterol for few years already.  Past Medical History:  Diagnosis Date   Hypercholesteremia    Hypertension     History reviewed. No pertinent surgical history.  Current Medications: Current Meds  Medication Sig   amLODipine  (NORVASC ) 10 MG  tablet Take 10 mg by mouth daily after supper.   aspirin EC 81 MG tablet Take 81 mg by mouth daily after supper. Swallow whole.   Cyanocobalamin (B-12 PO) Take 1 tablet by mouth daily.   Multiple Vitamin (MULTI-VITAMIN) tablet Take 1 tablet by mouth daily.   rosuvastatin  (CRESTOR ) 10 MG tablet Take 10 mg by mouth every morning.   [DISCONTINUED] metoprolol  tartrate (LOPRESSOR ) 50 MG tablet Take one tablet 2 hours before cardiac CT for heart greater than 55     Allergies:   Wasp venom protein and Lisinopril   Social History   Socioeconomic History   Marital status: Married    Spouse name: Not on file   Number of children: Not on file   Years of education: Not on file   Highest education level: Not on file  Occupational History   Not on file  Tobacco Use   Smoking status: Former    Current packs/day: 0.00    Types: Cigarettes    Quit date: 2    Years since quitting: 49.3   Smokeless tobacco: Never  Vaping Use   Vaping status: Never Used  Substance and Sexual Activity   Alcohol  use: Yes    Alcohol /week: 3.0 standard drinks of alcohol     Types: 3 Cans of beer per week   Drug use: Not Currently   Sexual activity: Not on file  Other Topics Concern   Not on file  Social History Narrative   **  Merged History Encounter **       Social Drivers of Corporate investment banker Strain: Not on file  Food Insecurity: Low Risk  (05/19/2023)   Received from [facility]   Hunger Vital Sign    Worried About Running Out of Food in the Last Year: Never true    Ran Out of Food in the Last Year: Never true  Transportation Needs: No Transportation Needs (05/19/2023)   Received from [facility]    In the past 12 months, has lack of reliable transportation kept you from medical appointments, meetings, work or from getting things needed for daily living? : No  Physical Activity: Not on file  Stress: Not on file  Social Connections: Not on file     Family  History: The patient's family history is not on file. ROS:   Please see the history of present illness.    All 14 point review of systems negative except as described per history of present illness.  EKGs/Labs/Other Studies Reviewed:    The following studies were reviewed today: Coronary CT as described above    EKG:  EKG Interpretation Date/Time:  Friday May 28 2023 11:20:53 EDT Ventricular Rate:  51 PR Interval:  212 QRS Duration:  92 QT Interval:  420 QTC Calculation: 387 R Axis:   67  Text Interpretation: Sinus bradycardia with 1st degree A-V block Otherwise normal ECG No previous ECGs available Confirmed by Ralene Burger [phone]) on 05/28/2023 11:28:33 AM    Recent Labs: No results found for requested labs within last 365 days.  Recent Lipid Panel No results found for: "CHOL", "TRIG", "HDL", "CHOLHDL", "VLDL", "LDLCALC", "LDLDIRECT"  Physical Exam:    VS:  BP (!) 140/82 (BP Location: Right Arm, Patient Position: Sitting)   Pulse (!) 51   Ht 5\' 10"  (1.778 m)   Wt 205 lb (93 kg)   SpO2 95%   BMI 29.41 kg/m     Wt Readings from Last 3 Encounters:  05/28/23 205 lb (93 kg)  08/03/20 197 lb (89.4 kg)     GEN:  Well nourished, well developed in no acute distress HEENT: Normal NECK: No JVD; No carotid bruits LYMPHATICS: No lymphadenopathy CARDIAC: RRR, no murmurs, no rubs, no gallops RESPIRATORY:  Clear to auscultation without rales, wheezing or rhonchi  ABDOMEN: Soft, non-tender, non-distended MUSCULOSKELETAL:  No edema; No deformity  SKIN: Warm and dry NEUROLOGIC:  Alert and oriented x 3 PSYCHIATRIC:  Normal affect   ASSESSMENT:    1. Precordial pain   2. Coronary artery disease of native artery of native heart with stable angina pectoris (HCC)   3. OSA (obstructive sleep apnea)   4. Hyperlipidemia, mixed    PLAN:    In order of problems listed above:  Coronary disease quite advanced atherosclerosis with her high number of calcified lesions.  Look  like there is a lot of diffuse disease including distal disease.  He is up to 1 month during our first branch appears to have hemodynamically significant stenosis which is fairly discrete.  We had a long discussion today about the scenario we talked about options for this including medications versus cardiac catheterization.  He prefers to try medication first, therefore, I put him on Imdur  30, will give him prescription for nitroglycerin , he is already on antiplatelet therapy which I will continue.  I gave him instructions on how to take nitroglycerin  with instruction to call 911 if nitroglycerin  does not relieve the pain.  Obviously if  pain became more frequent or change characteristic of his simply decide to pursue cardiac catheterization then we will go that route again what make me worry we do have a lot of distal disease but obtuse marginal branch appears to have some discrete stenosis. Obstructive sleep apnea he use CPAP mask on the regular basis. Dyslipidemia, I reviewed his fasting lipid profile which showed his LDL of 83, LDL of 9.  Will double the dose of Crestor  to 20 mg daily   Medication Adjustments/Labs and Tests Ordered: Current medicines are reviewed at length with the patient today.  Concerns regarding medicines are outlined above.  Orders Placed This Encounter  Procedures   EKG 12-Lead   No orders of the defined types were placed in this encounter.   Signed, Manfred Seed, MD, [facility]. 05/28/2023 11:54 AM    [facility]

## 2023-06-01 ENCOUNTER — Encounter: Payer: Self-pay | Admitting: Cardiology

## 2023-06-16 ENCOUNTER — Ambulatory Visit: Payer: Self-pay

## 2023-06-16 NOTE — Telephone Encounter (Signed)
-----   Message from Ralene Burger sent at 06/04/2023  9:45 AM EDT ----- This is over read of coronary CT angio which showed no extracardiac pathology

## 2023-06-16 NOTE — Telephone Encounter (Signed)
 Patient notified of results and verbalized understanding.

## 2023-06-23 ENCOUNTER — Ambulatory Visit: Attending: Cardiology

## 2023-06-23 DIAGNOSIS — I25118 Atherosclerotic heart disease of native coronary artery with other forms of angina pectoris: Secondary | ICD-10-CM

## 2023-06-24 ENCOUNTER — Ambulatory Visit: Payer: Self-pay | Admitting: Cardiology

## 2023-07-06 LAB — LIPID PANEL
Chol/HDL Ratio: 2.4 ratio (ref 0.0–5.0)
Cholesterol, Total: 122 mg/dL (ref 100–199)
HDL: 50 mg/dL (ref >39)
LDL Chol Calc (NIH): 58 mg/dL (ref 0–99)
Triglycerides: 67 mg/dL (ref 0–149)
VLDL Cholesterol Cal: 14 mg/dL (ref 5–40)

## 2023-07-06 LAB — ALT: ALT: 23 IU/L (ref 0–44)

## 2023-07-06 LAB — AST: AST: 26 IU/L (ref 0–40)

## 2023-07-08 ENCOUNTER — Ambulatory Visit: Attending: Cardiology | Admitting: Cardiology

## 2023-07-08 ENCOUNTER — Encounter: Payer: Self-pay | Admitting: Cardiology

## 2023-07-08 VITALS — BP 130/64 | HR 51 | Ht 70.0 in | Wt 197.4 lb

## 2023-07-08 DIAGNOSIS — E782 Mixed hyperlipidemia: Secondary | ICD-10-CM

## 2023-07-08 DIAGNOSIS — G4733 Obstructive sleep apnea (adult) (pediatric): Secondary | ICD-10-CM | POA: Diagnosis not present

## 2023-07-08 DIAGNOSIS — I25118 Atherosclerotic heart disease of native coronary artery with other forms of angina pectoris: Secondary | ICD-10-CM

## 2023-07-08 MED ORDER — ISOSORBIDE MONONITRATE ER 60 MG PO TB24
60.0000 mg | ORAL_TABLET | Freq: Every day | ORAL | 3 refills | Status: DC
Start: 2023-07-08 — End: 2023-11-22

## 2023-07-08 NOTE — Patient Instructions (Signed)
 Medication Instructions:   INCREASE: Imdur  to 60mg  1 tablet daily   Lab Work: None Ordered If you have labs (blood work) drawn today and your tests are completely normal, you will receive your results only by: MyChart Message (if you have MyChart) OR A paper copy in the mail If you have any lab test that is abnormal or we need to change your treatment, we will call you to review the results.   Testing/Procedures: None Ordered   Follow-Up: At [facility], you and your health needs are our priority.  As part of our continuing mission to provide you with exceptional heart care, we have created designated Provider Care Teams.  These Care Teams include your primary Cardiologist (physician) and Advanced Practice Providers (APPs -  Physician Assistants and Nurse Practitioners) who all work together to provide you with the care you need, when you need it.  We recommend signing up for the patient portal called "MyChart".  Sign up information is provided on this After Visit Summary.  MyChart is used to connect with patients for Virtual Visits (Telemedicine).  Patients are able to view lab/test results, encounter notes, upcoming appointments, etc.  Non-urgent messages can be sent to your provider as well.   To learn more about what you can do with MyChart, go to [email or web address].    Your next appointment:   3 month(s)  The format for your next appointment:   In Person  Provider:   Ralene Burger, MD    Other Instructions NA

## 2023-07-08 NOTE — Progress Notes (Signed)
 Cardiology Office Note:    Date:  07/08/2023   ID:  MARJORIE LUSSIER, DOB [birth year], MRN [id number]  PCP:  Karena Ota, MD  Cardiologist:  Ralene Burger, MD    Referring MD: Karena Ota, MD   Chief Complaint  Patient presents with   Follow-up    History of Present Illness:    Abbie Jablon Garris is a 78 y.o. male past medical history significant for coronary artery disease.  He did have abnormal coronary CT angiography which showed hemodynamically significant obstruction of the obtuse marginal of 1 branch as well as nondominant small RCA.  Additional problem include essential hypertension hyperlipidemia family history of premature coronary artery disease.  We elected to try medical therapy since he is very active he walks on the regular basis about 2-1/2 miles.  Comes today for follow-up she said overall he is doing well.  He can walk and he said that he is doing a little better with walking More energy less shortness of breath describes 1 episode of chest pain couple days ago that happen at rest lasting only for a minute or so.  Past Medical History:  Diagnosis Date   Hypercholesteremia    Hypertension     History reviewed. No pertinent surgical history.  Current Medications: Current Meds  Medication Sig   amLODipine  (NORVASC ) 10 MG tablet Take 10 mg by mouth daily after supper.   aspirin EC 81 MG tablet Take 81 mg by mouth daily after supper. Swallow whole.   Cyanocobalamin (B-12 PO) Take 1 tablet by mouth daily.   isosorbide  mononitrate (IMDUR ) 60 MG 24 hr tablet Take 1 tablet (60 mg total) by mouth daily.   Multiple Vitamin (MULTI-VITAMIN) tablet Take 1 tablet by mouth daily.   nitroGLYCERIN  (NITROSTAT ) 0.4 MG SL tablet Place 1 tablet (0.4 mg total) under the tongue every 5 (five) minutes as needed for chest pain.   rosuvastatin  (CRESTOR ) 20 MG tablet Take 1 tablet (20 mg total) by mouth daily.   [DISCONTINUED] isosorbide  mononitrate (IMDUR ) 30 MG 24 hr tablet Take 1  tablet (30 mg total) by mouth daily.     Allergies:   Wasp venom protein and Lisinopril   Social History   Socioeconomic History   Marital status: Married    Spouse name: Not on file   Number of children: Not on file   Years of education: Not on file   Highest education level: Not on file  Occupational History   Not on file  Tobacco Use   Smoking status: Former    Current packs/day: 0.00    Types: Cigarettes    Quit date: 30    Years since quitting: 49.4   Smokeless tobacco: Never  Vaping Use   Vaping status: Never Used  Substance and Sexual Activity   Alcohol  use: Yes    Alcohol /week: 3.0 standard drinks of alcohol     Types: 3 Cans of beer per week   Drug use: Not Currently   Sexual activity: Not on file  Other Topics Concern   Not on file  Social History Narrative   ** Merged History Encounter **       Social Drivers of Health   Financial Resource Strain: Not on file  Food Insecurity: Low Risk  (05/19/2023)   Received from [facility]   Hunger Vital Sign    Worried About Running Out of Food in the Last Year: Never true    Ran Out of Food in the Last Year: Never true  Transportation Needs: No Transportation Needs (05/19/2023)   Received from [facility]    In the past 12 months, has lack of reliable transportation kept you from medical appointments, meetings, work or from getting things needed for daily living? : No  Physical Activity: Not on file  Stress: Not on file  Social Connections: Not on file     Family History: The patient's family history is not on file. ROS:   Please see the history of present illness.    All 14 point review of systems negative except as described per history of present illness  EKGs/Labs/Other Studies Reviewed:         Recent Labs: 07/05/2023: ALT 23  Recent Lipid Panel    Component Value Date/Time   CHOL 122 07/05/2023 0904   TRIG 67 07/05/2023 0904   HDL 50 07/05/2023 0904   CHOLHDL 2.4  07/05/2023 0904   LDLCALC 58 07/05/2023 0904    Physical Exam:    VS:  BP 130/64 (BP Location: Right Arm, Patient Position: Sitting)   Pulse (!) 51   Ht 5\' 10"  (1.778 m)   Wt 197 lb 6.4 oz (89.5 kg)   SpO2 98%   BMI 28.32 kg/m     Wt Readings from Last 3 Encounters:  07/08/23 197 lb 6.4 oz (89.5 kg)  05/28/23 205 lb (93 kg)  08/03/20 197 lb (89.4 kg)     GEN:  Well nourished, well developed in no acute distress HEENT: Normal NECK: No JVD; No carotid bruits LYMPHATICS: No lymphadenopathy CARDIAC: RRR, no murmurs, no rubs, no gallops RESPIRATORY:  Clear to auscultation without rales, wheezing or rhonchi  ABDOMEN: Soft, non-tender, non-distended MUSCULOSKELETAL:  No edema; No deformity  SKIN: Warm and dry LOWER EXTREMITIES: no swelling NEUROLOGIC:  Alert and oriented x 3 PSYCHIATRIC:  Normal affect   ASSESSMENT:    1. Coronary artery disease of native artery of native heart with stable angina pectoris (HCC)   2. OSA (obstructive sleep apnea)   3. Hyperlipidemia, mixed    PLAN:    In order of problems listed above:  Coronary disease.  Obtuse marginal branch disease as well as small nondominant RCA.  So far we are electing to pursue medical therapy.  He seems to be responding nicely.  Will continue.  I will increase dose of Imdur  to 60 mg daily. Obstructive sleep apnea will be followed by antimedicine team. Dyslipidemia I did review his last fasting lipid profile which is excellent I will continue present management   Medication Adjustments/Labs and Tests Ordered: Current medicines are reviewed at length with the patient today.  Concerns regarding medicines are outlined above.  No orders of the defined types were placed in this encounter.  Medication changes:  Meds ordered this encounter  Medications   isosorbide  mononitrate (IMDUR ) 60 MG 24 hr tablet    Sig: Take 1 tablet (60 mg total) by mouth daily.    Dispense:  90 tablet    Refill:  3    Signed, Manfred Seed, MD, [facility] 07/08/2023 2:46 PM    [facility]

## 2023-07-13 ENCOUNTER — Telehealth: Payer: Self-pay

## 2023-07-13 NOTE — Telephone Encounter (Signed)
 LVM per DPR- per Dr. Tonja Fray note regarding normal Carotid US  results. Encouraged to call with any questions. Routed to PCP.

## 2023-08-12 ENCOUNTER — Other Ambulatory Visit: Payer: Self-pay | Admitting: Cardiology

## 2023-09-23 DIAGNOSIS — I2699 Other pulmonary embolism without acute cor pulmonale: Secondary | ICD-10-CM | POA: Diagnosis not present

## 2023-09-23 DIAGNOSIS — I358 Other nonrheumatic aortic valve disorders: Secondary | ICD-10-CM | POA: Diagnosis not present

## 2023-09-24 DIAGNOSIS — I2699 Other pulmonary embolism without acute cor pulmonale: Secondary | ICD-10-CM | POA: Diagnosis not present

## 2023-09-25 DIAGNOSIS — I2510 Atherosclerotic heart disease of native coronary artery without angina pectoris: Secondary | ICD-10-CM | POA: Diagnosis not present

## 2023-09-25 DIAGNOSIS — I2699 Other pulmonary embolism without acute cor pulmonale: Secondary | ICD-10-CM | POA: Diagnosis not present

## 2023-09-27 ENCOUNTER — Telehealth: Payer: Self-pay | Admitting: Cardiology

## 2023-09-27 NOTE — Telephone Encounter (Signed)
 Spoke with pt regarding message. Dr. Krasowski recommended that he continue his Metoprolol  until he comes in on 10-08-23. Pt verbalized understanding and had no further questions.

## 2023-09-27 NOTE — Telephone Encounter (Signed)
 Pt c/o medication issue:  1. Name of Medication:  Metoprolol  25 MG, 1 tablet daily  2. How are you currently taking this medication (dosage and times per day)?   3. Are you having a reaction (difficulty breathing--STAT)?   4. What is your medication issue?   Patient was prescribed Metoprolol  while in the hospital. He would like to know if Dr. Bernie agrees with him starting on this medication. He would also like to know if he needs to have lab work prior to upcoming appointment. Please advise.

## 2023-10-06 ENCOUNTER — Ambulatory Visit: Payer: Self-pay | Admitting: Internal Medicine

## 2023-10-06 ENCOUNTER — Encounter: Payer: Self-pay | Admitting: Internal Medicine

## 2023-10-06 VITALS — BP 120/66 | HR 62 | Temp 97.8°F | Resp 18 | Ht 70.0 in | Wt 187.8 lb

## 2023-10-06 DIAGNOSIS — I2510 Atherosclerotic heart disease of native coronary artery without angina pectoris: Secondary | ICD-10-CM | POA: Diagnosis not present

## 2023-10-06 DIAGNOSIS — I10 Essential (primary) hypertension: Secondary | ICD-10-CM | POA: Insufficient documentation

## 2023-10-06 DIAGNOSIS — I2699 Other pulmonary embolism without acute cor pulmonale: Secondary | ICD-10-CM | POA: Diagnosis not present

## 2023-10-06 DIAGNOSIS — Z6826 Body mass index (BMI) 26.0-26.9, adult: Secondary | ICD-10-CM

## 2023-10-06 DIAGNOSIS — E782 Mixed hyperlipidemia: Secondary | ICD-10-CM

## 2023-10-06 HISTORY — DX: Essential (primary) hypertension: I10

## 2023-10-06 HISTORY — DX: Other pulmonary embolism without acute cor pulmonale: I26.99

## 2023-10-06 NOTE — Assessment & Plan Note (Signed)
 He will followup with pulmonary and cardiology as directed.  They want him to be on anticoagulation for 6 months.

## 2023-10-06 NOTE — Assessment & Plan Note (Signed)
 His BP is doing well.  They just added metoprolol  in the hospital where he had some bigeminy where they added the metoprolol .

## 2023-10-06 NOTE — Assessment & Plan Note (Signed)
 We will continue on his statin, ASA, imdur  and beta blocker.  He denies any symptoms of angina today.

## 2023-10-06 NOTE — Progress Notes (Signed)
 Office Visit  Subjective   [name] ID: Timothy [name]   DOB: 25-May-1945   Age: 78 y.o.   MRN: [id number]   Chief Complaint Chief Complaint  [name] presents with   Establish Care    Pt in today to establish care.      History of Present Illness Timothy [name] is a 78 yo male who comes in today to establish care.  He was previously followed by Dr. Magdaline here in town with his last visit in 05/2023.  I am told that Dr. Magdaline is retiring and Timothy [name] is looking for a new primary care doctor.  His last annual wellness exam per Timothy [name] was in 01/2023.  Timothy [name] is a 78 year old male who presents for a follow-up evaluation of hypertension.  He was diagnosed with HTN maybe 30-35 years.  Timothy [name] has been checking his blood pressure at home. Timothy [name]'s blood pressure has ranged systollically 130's.   They just started him on metoprolol  in [facility] ER (See below).  Timothy [name]'s current medications include: amlodipine  10mg  daily and metoprolol  ER 25mg  daily. Timothy [name] has been tolerating her medications well. Timothy [name] denies any headache, visual changes, dizziness, lightheadness, chest pain, shortness of breath, weakness/numbness, and edema.  She reports there have been no other symptoms noted.  Timothy [name] also has a history of hypercholesterolemia.  Overall, he states he is doing well and is without any complaints or problems at this time. He specifically denies abdominal pain, nausea, vomiting, diarrhea, myalgias, and fatigue. He remains on dietary management as well as a regular exercise program and Timothy following cholesterol lowering medications with crestor  20mg  qhs. She is fasting in anticipation for labs today.   This [name] also has moderately severe CAD of about years known duration and presents today for a status visit. This past year he was chest pain where he saw cardiology.  He had an abnormal coronary CT angiography which showed hemodynamically significant obstruction of Timothy  obtuse marginal of 1 branch as well as nondominant small RCA.  Additional problem include essential hypertension hyperlipidemia family history of premature coronary artery disease.  Cardiology and Timothy [name] elected to try medical therapy since he is very active where he walks on Timothy regular basis about 2-1/2 miles.  He is currently on metoprolol  ER, imdur , crestor , and ASA 81mg  daily.  He has never had a heart attack.  His CAD is controlled with therapy as summarized in Timothy medication list and previous notes. Timothy [name] has no comorbid conditions. He has Timothy following baseline symptoms: none. She has Timothy following modifiable risk factor(s): HTN, hyperlipidemia. Specifically denied complaint(s): chest pain, palpitations, orthopnea, edema, exertional dyspnea, and syncope.    Timothy [name] was recently hospitalized at [facility] for pulmonary embolism where he was admitted from 09/23/2023 until 09/25/2023.  Timothy [name] states he was having cough and had visited Timothy urgent multiple times and was told he had pneumonia and was placed on antibiotics.  He did not improve and had worsening SOB with pleuritic chest pain and hemoptysis which prompted him to go to [facility] ER.  They did a CTA of his chest on 09/23/2023 and this showed large central partly occlusive pulmonary emboli in Timothy right lower lobe vessels. There was nonocclusive central pulmonary embolus to Timothy left upper lobe and to Timothy left lower lobe branches.  There was an infiltrate in Timothy right lung base, likely developing pulmonary infarct,measuring 8.5 x 4.0 cm. There was peripheral infiltrate in  Timothy left lung base consistent with developing infarct measuring 3.7 by 6.5 cm.  There was a right pleural effusion measuring 2 cm in depth.  He was continued on antibiotics and started on eliquis.  They did an ECHO on 09/23/2023 and this showed a normal LV function with LVEF of 60-65%.  Timothy LA was mildly dilated.  There was mild aortic sclerosis without evidence of stenosis.  His  PASP was normal.  His RV systolic function was normal.  He was asked to followup with his cardiologist at discharge.  They mentioned treating his PE for 6 months.  They want him to followup with pulmonary and cardiology.  Timothy [name] quit smoking in 1976.     Past Medical History Past Medical History:  Diagnosis Date   CAD (coronary artery disease)    Hypercholesteremia    Hypertension    OSA (obstructive sleep apnea)    Pulmonary embolism (HCC)      Allergies Allergies  Allergen Reactions   Wasp Venom Protein Anaphylaxis   Lisinopril Hives     Medications  Current Outpatient Medications:    amLODipine  (NORVASC ) 10 MG tablet, Take 10 mg by mouth daily after supper., Disp: , Rfl:    apixaban (ELIQUIS) 5 MG TABS tablet, Take 5 mg by mouth daily., Disp: , Rfl:    aspirin EC 81 MG tablet, Take 81 mg by mouth daily after supper. Swallow whole., Disp: , Rfl:    Cyanocobalamin (B-12 PO), Take 1 tablet by mouth daily., Disp: , Rfl:    isosorbide  mononitrate (IMDUR ) 60 MG 24 hr tablet, Take 1 tablet (60 mg total) by mouth daily., Disp: 90 tablet, Rfl: 3   metoprolol  succinate (TOPROL -XL) 25 MG 24 hr tablet, Take 25 mg by mouth daily., Disp: , Rfl:    Multiple Vitamin (MULTI-VITAMIN) tablet, Take 1 tablet by mouth daily., Disp: , Rfl:    nitroGLYCERIN  (NITROSTAT ) 0.4 MG SL tablet, Place 1 tablet (0.4 mg total) under Timothy tongue every 5 (five) minutes as needed for chest pain., Disp: 25 tablet, Rfl: 6   pantoprazole (PROTONIX) 20 MG tablet, Take 20 mg by mouth 2 (two) times daily., Disp: , Rfl:    rosuvastatin  (CRESTOR ) 20 MG tablet, Take 1 tablet by mouth once daily, Disp: 90 tablet, Rfl: 1   Review of Systems Review of Systems  Constitutional:  Negative for chills, fever and malaise/fatigue.  Eyes:  Negative for blurred vision and double vision.  Respiratory:  Negative for cough, hemoptysis, shortness of breath and wheezing.   Cardiovascular:  Negative for chest pain, palpitations and  leg swelling.  Gastrointestinal:  Negative for abdominal pain, blood in stool, constipation, diarrhea, melena, nausea and vomiting.  Genitourinary:  Negative for frequency and hematuria.  Musculoskeletal:  Negative for myalgias.  Skin:  Negative for itching and rash.  Neurological:  Negative for dizziness, weakness and headaches.  Endo/Heme/Allergies:  Negative for polydipsia.       Objective:    Vitals BP 120/66   Pulse 62   Temp 97.8 F (36.6 C) (Temporal)   Resp 18   Ht 5' 10 (1.778 m)   Wt 187 lb 12.8 oz (85.2 kg)   SpO2 97%   BMI 26.95 kg/m    Physical Examination Physical Exam Constitutional:      Appearance: Normal appearance. He is not ill-appearing.  Neck:     Vascular: No carotid bruit.  Cardiovascular:     Rate and Rhythm: Normal rate and regular rhythm.     Pulses:  Normal pulses.     Heart sounds: No murmur heard.    No friction rub. No gallop.  Pulmonary:     Effort: Pulmonary effort is normal. No respiratory distress.     Breath sounds: No wheezing, rhonchi or rales.  Abdominal:     General: Abdomen is flat. Bowel sounds are normal. There is no distension.     Palpations: Abdomen is soft.     Tenderness: There is no abdominal tenderness.  Musculoskeletal:     Right lower leg: No edema.     Left lower leg: No edema.  Skin:    General: Skin is warm and dry.     Findings: No rash.  Neurological:     General: No focal deficit present.     Mental Status: He is alert and oriented to person, place, and time.  Psychiatric:        Mood and Affect: Mood normal.        Behavior: Behavior normal.        Assessment & Plan:   Essential hypertension His BP is doing well.  They just added metoprolol  in [facility] hospital where he had some bigeminy where they added Timothy metoprolol .  Coronary artery disease coronary CT angio showing distal disease, calcium  score of 3168, FFR showed diffuse disease with severe midportion obtuse marginal branch We will continue on  his statin, ASA, imdur  and beta blocker.  He denies any symptoms of angina today.  Acute pulmonary embolism without acute cor pulmonale (HCC) He will followup with pulmonary and cardiology as directed.  They want him to be on anticoagulation for 6 months.  Hyperlipidemia, mixed His last FLP was done in 07/2023 and is at goal.  We will continue on his crestor .    Return in about 3 months (around 01/05/2024).   Selinda Fleeta Finger, MD

## 2023-10-06 NOTE — Assessment & Plan Note (Signed)
 His last FLP was done in 07/2023 and is at goal.  We will continue on his crestor .

## 2023-10-07 DIAGNOSIS — I10 Essential (primary) hypertension: Secondary | ICD-10-CM | POA: Insufficient documentation

## 2023-10-07 DIAGNOSIS — E7800 Pure hypercholesterolemia, unspecified: Secondary | ICD-10-CM | POA: Insufficient documentation

## 2023-10-07 DIAGNOSIS — I2699 Other pulmonary embolism without acute cor pulmonale: Secondary | ICD-10-CM | POA: Insufficient documentation

## 2023-10-07 DIAGNOSIS — I2510 Atherosclerotic heart disease of native coronary artery without angina pectoris: Secondary | ICD-10-CM | POA: Insufficient documentation

## 2023-10-08 ENCOUNTER — Encounter: Payer: Self-pay | Admitting: Cardiology

## 2023-10-08 ENCOUNTER — Ambulatory Visit: Attending: Cardiology | Admitting: Cardiology

## 2023-10-08 VITALS — BP 122/62 | HR 46 | Ht 70.0 in | Wt 189.8 lb

## 2023-10-08 DIAGNOSIS — I25118 Atherosclerotic heart disease of native coronary artery with other forms of angina pectoris: Secondary | ICD-10-CM

## 2023-10-08 DIAGNOSIS — K219 Gastro-esophageal reflux disease without esophagitis: Secondary | ICD-10-CM

## 2023-10-08 DIAGNOSIS — I10 Essential (primary) hypertension: Secondary | ICD-10-CM | POA: Diagnosis not present

## 2023-10-08 DIAGNOSIS — Z86711 Personal history of pulmonary embolism: Secondary | ICD-10-CM

## 2023-10-08 DIAGNOSIS — G4733 Obstructive sleep apnea (adult) (pediatric): Secondary | ICD-10-CM

## 2023-10-08 MED ORDER — METOPROLOL SUCCINATE ER 25 MG PO TB24: 25.0000 mg | ORAL_TABLET | Freq: Every day | ORAL | 3 refills | Status: DC

## 2023-10-08 MED ORDER — CLOPIDOGREL BISULFATE 75 MG PO TABS: 75.0000 mg | ORAL_TABLET | Freq: Every day | ORAL | 3 refills | Status: AC

## 2023-10-08 NOTE — Patient Instructions (Signed)
 Medication Instructions:   STOP : Aspirin  START: Plavix  75mg  1 tablet daily   Lab Work: None Ordered If you have labs (blood work) drawn today and your tests are completely normal, you will receive your results only by: MyChart Message (if you have MyChart) OR A paper copy in the mail If you have any lab test that is abnormal or we need to change your treatment, we will call you to review the results.   Testing/Procedures: None Ordered   Follow-Up: At [facility], you and your health needs are our priority.  As part of our continuing mission to provide you with exceptional heart care, we have created designated Provider Care Teams.  These Care Teams include your primary Cardiologist (physician) and Advanced Practice Providers (APPs -  Physician Assistants and Nurse Practitioners) who all work together to provide you with the care you need, when you need it.  We recommend signing up for the patient portal called MyChart.  Sign up information is provided on this After Visit Summary.  MyChart is used to connect with patients for Virtual Visits (Telemedicine).  Patients are able to view lab/test results, encounter notes, upcoming appointments, etc.  Non-urgent messages can be sent to your provider as well.   To learn more about what you can do with MyChart, go to [email or web address].    Your next appointment:   3 month(s)  The format for your next appointment:   In Person  Provider:   Lamar Fitch, MD    Other Instructions NA
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
## 2023-10-27 ENCOUNTER — Telehealth: Payer: Self-pay | Admitting: Cardiology

## 2023-10-27 NOTE — Telephone Encounter (Signed)
 STAT if HR is under 50 or over 120  (normal HR is 60-100 beats per minute)  What is your heart rate? Last yesterday 40, today walking up hill 84  Do you have a log of your heart rate readings (document readings)? no  Do you have any other symptoms? No   Pt wanted to know what a normal heart rate would be for him Please Advise

## 2023-10-29 NOTE — Telephone Encounter (Signed)
 Per Dr. Krasowski, advised to stop Metoprolol  due to low HR. Pt verbalized understanding and had no further questions.

## 2023-11-15 ENCOUNTER — Telehealth: Payer: Self-pay | Admitting: Cardiology

## 2023-11-15 NOTE — Telephone Encounter (Signed)
 Pt c/o medication issue:  1. Name of Medication: metoprolol  succinate (TOPROL -XL) 25 MG 24 hr tablet   2. How are you currently taking this medication (dosage and times per day)? N/A  3. Are you having a reaction (difficulty breathing--STAT)? No  4. What is your medication issue? Pt would like to know if he should start this medication again since he thinks it would help his HR. Pt didn't have any reading at the time.

## 2023-11-15 NOTE — Telephone Encounter (Signed)
 Spoke with pt who states that he stopped Metoprolol  10/29/23 due to low heart rate. Pt states the past few days his BP has been 161/80 HR in the 50's. Please advise

## 2023-11-22 ENCOUNTER — Telehealth: Payer: Self-pay | Admitting: Cardiology

## 2023-11-22 MED ORDER — ISOSORBIDE MONONITRATE ER 120 MG PO TB24: 120.0000 mg | ORAL_TABLET | Freq: Every day | ORAL | 3 refills | Status: AC

## 2023-11-22 NOTE — Telephone Encounter (Signed)
 Pt made aware, he verbalized understanding and had no further questions

## 2023-11-22 NOTE — Telephone Encounter (Signed)
 Pt came in office today and stated that he spoke with a nurse last week regarding some concerns he had with his BP but never heard back, he is requesting a return call today.

## 2023-11-22 NOTE — Telephone Encounter (Signed)
 Pt had stopped Metoprolol  and now BP is up. Per Dr. Krasowski, increase Imdur  to 120mg  daily continue off Metoprolol .

## 2023-11-24 NOTE — Telephone Encounter (Signed)
 Spoke with pt. He reported feeling good with no symptoms, dizziness or passing out. He will send a few blood pressure readings for review.

## 2023-11-30 ENCOUNTER — Telehealth: Payer: Self-pay | Admitting: Cardiology

## 2023-11-30 NOTE — Telephone Encounter (Signed)
 Pt c/o medication issue:  1. Name of Medication: isosorbide  mononitrate (IMDUR ) 120 MG 24 hr tablet   2. How are you currently taking this medication (dosage and times per day)?  As written 3. Are you having a reaction (difficulty breathing--STAT)? no  4. What is your medication issue? Pt states his dose was adjusted and states his BP has been normal and was wondering if his dose should be taken down BP has been 130, 144, 148/81 please advise

## 2023-12-06 NOTE — Telephone Encounter (Signed)
 Pt is concerned that his BP is staying 130-150 systolic and not sure what it is supposed to be. Please advise.

## 2023-12-09 ENCOUNTER — Encounter: Payer: Self-pay | Admitting: Physician Assistant

## 2023-12-09 NOTE — Telephone Encounter (Signed)
 Recommendations reviewed with pt as per Dr. Debarah Faes note.  Pt verbalized understanding and had no additional questions.
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
## 2023-12-16 ENCOUNTER — Encounter: Payer: Self-pay | Admitting: Physician Assistant

## 2023-12-16 ENCOUNTER — Ambulatory Visit: Admitting: Physician Assistant

## 2023-12-16 VITALS — BP 114/60 | HR 72 | Ht 70.0 in | Wt 190.1 lb

## 2023-12-16 DIAGNOSIS — I2583 Coronary atherosclerosis due to lipid rich plaque: Secondary | ICD-10-CM

## 2023-12-16 DIAGNOSIS — Z860101 Personal history of adenomatous and serrated colon polyps: Secondary | ICD-10-CM

## 2023-12-16 DIAGNOSIS — D696 Thrombocytopenia, unspecified: Secondary | ICD-10-CM

## 2023-12-16 DIAGNOSIS — I2699 Other pulmonary embolism without acute cor pulmonale: Secondary | ICD-10-CM

## 2023-12-16 DIAGNOSIS — I2510 Atherosclerotic heart disease of native coronary artery without angina pectoris: Secondary | ICD-10-CM | POA: Diagnosis not present

## 2023-12-16 DIAGNOSIS — I25118 Atherosclerotic heart disease of native coronary artery with other forms of angina pectoris: Secondary | ICD-10-CM

## 2023-12-16 NOTE — Patient Instructions (Signed)
 You have been scheduled for an abdominal ultrasound at [facility] (1st floor of hospital) on Friday, 01/07/24 at 8:45 am. Please arrive 30 minutes prior to your appointment for registration. Make certain not to have anything to eat or drink after midnight  prior to your appointment. Should you need to reschedule your appointment, please contact radiology at [phone]. This test typically takes about 30 minutes to perform.   Follow up in March 2026.  Thank you for trusting me with your gastrointestinal care!  Alan Coombs, PA-C  _______________________________________________________  If your blood pressure at your visit was 140/90 or greater, please contact your primary care physician to follow up on this.  _______________________________________________________  If you are age 6 or older, your body mass index should be between 23-30. Your Body mass index is 27.28 kg/m. If this is out of the aforementioned range listed, please consider follow up with your Primary Care Provider.  If you are age 73 or younger, your body mass index should be between 19-25. Your Body mass index is 27.28 kg/m. If this is out of the aformentioned range listed, please consider follow up with your Primary Care Provider.   ________________________________________________________  The [facility] providers would like to encourage you to use MYCHART to communicate with providers for non-urgent requests or questions.  Due to long hold times on the telephone, sending your provider a message by [facility] may be a faster and more efficient way to get a response.  Please allow 48 business hours for a response.  Please remember that this is for non-urgent requests.  _______________________________________________________  [facility] is using a team-based approach to care.  Your team is made up of your doctor and two to three APPS. Our APPS (Nurse Practitioners and Physician Assistants) work with  your physician to ensure care continuity for you. They are fully qualified to address your health concerns and develop a treatment plan. They communicate directly with your gastroenterologist to care for you. Seeing the Advanced Practice Practitioners on your physician's team can help you by facilitating care more promptly, often allowing for earlier appointments, access to diagnostic testing, procedures, and other specialty referrals.

## 2024-01-06 ENCOUNTER — Encounter: Payer: Self-pay | Admitting: Internal Medicine

## 2024-01-06 ENCOUNTER — Ambulatory Visit: Admitting: Internal Medicine

## 2024-01-06 VITALS — BP 124/56 | HR 48 | Temp 97.5°F | Resp 16 | Ht 70.0 in | Wt 191.0 lb

## 2024-01-06 DIAGNOSIS — I10 Essential (primary) hypertension: Secondary | ICD-10-CM

## 2024-01-06 DIAGNOSIS — D696 Thrombocytopenia, unspecified: Secondary | ICD-10-CM

## 2024-01-06 NOTE — Assessment & Plan Note (Signed)
 He goes back to see GI tomorrow.  He will probably utimately need a hematology referral.
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
## 2024-01-06 NOTE — Assessment & Plan Note (Signed)
 His BP is doing well.  He was on metoprolol  XL for bigeminy in the past but he stopped that.  We will continue on amlodiipine at this time.

## 2024-01-07 ENCOUNTER — Inpatient Hospital Stay (INDEPENDENT_AMBULATORY_CARE_PROVIDER_SITE_OTHER)
Admission: RE | Admit: 2024-01-07 | Discharge: 2024-01-07 | Disposition: A | Source: Ambulatory Visit | Attending: Physician Assistant | Admitting: Physician Assistant

## 2024-01-07 DIAGNOSIS — D696 Thrombocytopenia, unspecified: Secondary | ICD-10-CM

## 2024-01-11 ENCOUNTER — Ambulatory Visit: Payer: Self-pay | Admitting: Physician Assistant

## 2024-01-12 ENCOUNTER — Ambulatory Visit: Attending: Cardiology | Admitting: Cardiology

## 2024-01-12 ENCOUNTER — Encounter: Payer: Self-pay | Admitting: Cardiology

## 2024-01-12 VITALS — BP 132/64 | HR 68 | Ht 70.0 in | Wt 191.2 lb

## 2024-01-12 DIAGNOSIS — Z86711 Personal history of pulmonary embolism: Secondary | ICD-10-CM | POA: Diagnosis not present

## 2024-01-12 DIAGNOSIS — I25118 Atherosclerotic heart disease of native coronary artery with other forms of angina pectoris: Secondary | ICD-10-CM

## 2024-01-12 DIAGNOSIS — E7800 Pure hypercholesterolemia, unspecified: Secondary | ICD-10-CM
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
## 2024-01-12 NOTE — Patient Instructions (Signed)
 Medication Instructions:  Your physician recommends that you continue on your current medications as directed. Please refer to the Current Medication list given to you today.  *If you need a refill on your cardiac medications before your next appointment, please call your pharmacy*   Lab Work: None Ordered If you have labs (blood work) drawn today and your tests are completely normal, you will receive your results only by: MyChart Message (if you have MyChart) OR A paper copy in the mail If you have any lab test that is abnormal or we need to change your treatment, we will call you to review the results.   Testing/Procedures: None Ordered   Follow-Up: At [facility], you and your health needs are our priority.  As part of our continuing mission to provide you with exceptional heart care, we have created designated Provider Care Teams.  These Care Teams include your primary Cardiologist (physician) and Advanced Practice Providers (APPs -  Physician Assistants and Nurse Practitioners) who all work together to provide you with the care you need, when you need it.  We recommend signing up for the patient portal called MyChart.  Sign up information is provided on this After Visit Summary.  MyChart is used to connect with patients for Virtual Visits (Telemedicine).  Patients are able to view lab/test results, encounter notes, upcoming appointments, etc.  Non-urgent messages can be sent to your provider as well.   To learn more about what you can do with MyChart, go to [email or web address].    Your next appointment:   5 month(s)  The format for your next appointment:   In Person  Provider:   Lamar Fitch, MD    Other Instructions NA

## 2024-04-06 ENCOUNTER — Ambulatory Visit: Admitting: Internal Medicine
